# Patient Record
Sex: Male | Born: 2000 | Race: White | Hispanic: No | Marital: Single | Smoking: Never smoker
Health system: Southern US, Community
[De-identification: ages and names within clinical notes are randomized; demographics above are authoritative.]

---
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
## 2021-05-01 ENCOUNTER — Telehealth: Payer: Self-pay | Admitting: Cardiology

## 2021-05-01 NOTE — Addendum Note (Signed)
Addended by: Kerney Elbe on: 05/01/2021 02:08 PM   Modules accepted: Orders

## 2021-05-01 NOTE — Telephone Encounter (Signed)
New Echo order placed.

## 2021-05-01 NOTE — Telephone Encounter (Signed)
Patient walked into office asking to have his echo re-scheduled.  Will need a new order placed and then will contact patient.

## 2021-06-12 ENCOUNTER — Ambulatory Visit (HOSPITAL_COMMUNITY)
Admission: RE | Admit: 2021-06-12 | Discharge: 2021-06-12 | Disposition: A | Discharge status: Home / Self Care | Payer: Self-pay | Source: Ambulatory Visit | Attending: Cardiology | Admitting: Cardiology

## 2021-06-12 ENCOUNTER — Telehealth: Payer: Self-pay

## 2021-06-12 ENCOUNTER — Other Ambulatory Visit: Payer: Self-pay

## 2021-06-12 DIAGNOSIS — R0789 Other chest pain: Secondary | ICD-10-CM | POA: Insufficient documentation

## 2021-06-12 LAB — ECHOCARDIOGRAM COMPLETE
AR max vel: 1.74 cm2
AV Area VTI: 1.88 cm2
AV Area mean vel: 1.89 cm2
AV Mean grad: 6 mmHg
AV Peak grad: 13.4 mmHg
Ao pk vel: 1.83 m/s
Area-P 1/2: 2.95 cm2
S' Lateral: 2.7 cm

## 2021-06-12 NOTE — Progress Notes (Signed)
*  PRELIMINARY RESULTS* Echocardiogram 2D Echocardiogram has been performed.  Stacey Drain 06/12/2021, 3:05 PM

## 2021-06-12 NOTE — Telephone Encounter (Signed)
Patient notified and verbalized understanding. Pt had no questions or concerns at this time 

## 2021-06-12 NOTE — Telephone Encounter (Signed)
-----   Message from Jonelle Sidle, MD sent at 06/12/2021  4:28 PM EST ----- Results reviewed.  Reassuring results with normal LVEF at 60 to 65%, normal LV wall thickness as well.  Normal RV contraction and no significant valvular abnormalities.  Please see recent office note, no further cardiac work-up anticipated at this time.

## 2021-09-30 ENCOUNTER — Ambulatory Visit
Admission: EM | Admit: 2021-09-30 | Discharge: 2021-09-30 | Disposition: A | Discharge status: Home / Self Care | Payer: No Typology Code available for payment source

## 2021-09-30 ENCOUNTER — Other Ambulatory Visit: Payer: Self-pay

## 2021-09-30 DIAGNOSIS — R52 Pain, unspecified: Secondary | ICD-10-CM | POA: Diagnosis not present

## 2021-09-30 DIAGNOSIS — Z20828 Contact with and (suspected) exposure to other viral communicable diseases: Secondary | ICD-10-CM | POA: Diagnosis not present

## 2021-09-30 DIAGNOSIS — R509 Fever, unspecified: Secondary | ICD-10-CM | POA: Diagnosis not present

## 2021-09-30 DIAGNOSIS — R112 Nausea with vomiting, unspecified: Secondary | ICD-10-CM

## 2021-09-30 MED ORDER — ONDANSETRON 4 MG PO TBDP
4.0000 mg | ORAL_TABLET | Freq: Once | ORAL | Status: AC
  Administered 2021-09-30: 4 mg via ORAL

## 2021-09-30 MED ORDER — ONDANSETRON 8 MG PO TBDP: 8.0000 mg | ORAL_TABLET | Freq: Three times a day (TID) | ORAL | 0 refills | Status: AC | PRN

## 2021-09-30 NOTE — ED Provider Notes (Signed)
?[facility] ? ? ? ?CSN: [id number] ?Arrival date & time: 09/30/21  0919 ? ? ?  ? ?History   ?Chief Complaint ?Chief Complaint  ?Patient presents with  ? Emesis  ?  Body aches, weak and vomiting ?  ? ? ?HPI ?Robert Harvey is a 21 y.o. male.  ? ?Presenting today with 1 day history of sudden onset nausea, vomiting, weakness, generalized body aches, fevers, sweats.  Denies cough, congestion, sore throat, diarrhea, constipation, chest pain, shortness of breath.  Has not taken anything over-the-counter for symptoms thus far.  States he ate some fast food last night and wonders if that made him sick.  No known sick contacts recently. ? ? ?History reviewed. No pertinent past medical history. ? ?There are no problems to display for this patient. ? ? ?History reviewed. No pertinent surgical history. ? ? ? ? ?Home Medications   ? ?Prior to Admission medications   ?Medication Sig Start Date End Date Taking? Authorizing Provider  ?ondansetron (ZOFRAN-ODT) 8 MG disintegrating tablet Take 1 tablet (8 mg total) by mouth every 8 (eight) hours as needed for nausea or vomiting. 09/30/21  Yes Particia Nearing, PA-C  ?escitalopram (LEXAPRO) 10 MG tablet Take 10 mg by mouth daily. 09/10/21   [provider]  ? ? ?Family History ?Family History  ?Problem Relation Age of Onset  ? Hypertension Father   ? Heart attack Sister 24  ?     After pregnancy  ? Heart attack Paternal Grandfather   ? ? ?Social History ?Social History  ? ?Tobacco Use  ? Smoking status: Never  ? Smokeless tobacco: Never  ?Vaping Use  ? Vaping Use: Every day  ? Substances: Nicotine, Flavoring  ?Substance Use Topics  ? Alcohol use: Yes  ?  Comment: "a little bit"   ? Drug use: No  ? ? ? ?Allergies   ?Patient has no known allergies. ? ? ?Review of Systems ?Review of Systems ?Per HPI ? ?Physical Exam ?Triage Vital Signs ?ED Triage Vitals  ?Enc Vitals Group  ?   BP 09/30/21 0940 (!) 90/57  ?   Pulse Rate 09/30/21 0940 (!) 112  ?   Resp 09/30/21 0940  20  ?   Temp 09/30/21 0940 99.6 ?F (37.6 ?C)  ?   Temp Source 09/30/21 0940 Oral  ?   SpO2 09/30/21 0940 95 %  ?   Weight --   ?   Height --   ?   Head Circumference --   ?   Peak Flow --   ?   Pain Score 09/30/21 0939 6  ?   Pain Loc --   ?   Pain Edu? --   ?   Excl. in GC? --   ? ?No data found. ? ?Updated Vital Signs ?BP (!) 90/57 (BP Location: Right Arm)   Pulse (!) 112   Temp 99.6 ?F (37.6 ?C) (Oral)   Resp 20   SpO2 95%  ? ?Visual Acuity ?Right Eye Distance:   ?Left Eye Distance:   ?Bilateral Distance:   ? ?Right Eye Near:   ?Left Eye Near:    ?Bilateral Near:    ? ?Physical Exam ?Vitals and nursing note reviewed.  ?Constitutional:   ?   Appearance: Normal appearance.  ?HENT:  ?   Head: Atraumatic.  ?   Nose: Nose normal.  ?   Mouth/Throat:  ?   Mouth: Mucous membranes are moist.  ?   Pharynx: No oropharyngeal exudate or  posterior oropharyngeal erythema.  ?Eyes:  ?   Extraocular Movements: Extraocular movements intact.  ?   Conjunctiva/sclera: Conjunctivae normal.  ?Cardiovascular:  ?   Rate and Rhythm: Normal rate and regular rhythm.  ?   Heart sounds: Normal heart sounds.  ?Pulmonary:  ?   Effort: Pulmonary effort is normal.  ?   Breath sounds: Normal breath sounds.  ?Abdominal:  ?   General: Bowel sounds are normal. There is no distension.  ?   Palpations: Abdomen is soft.  ?   Tenderness: There is no abdominal tenderness. There is no guarding.  ?Musculoskeletal:     ?   General: Normal range of motion.  ?   Cervical back: Normal range of motion and neck supple.  ?Skin: ?   General: Skin is warm and dry.  ?Neurological:  ?   General: No focal deficit present.  ?   Mental Status: He is oriented to person, place, and time.  ?Psychiatric:     ?   Mood and Affect: Mood normal.     ?   Thought Content: Thought content normal.     ?   Judgment: Judgment normal.  ? ? ?UC Treatments / Results  ?Labs ?(all labs ordered are listed, but only abnormal results are displayed) ?Labs Reviewed  ?COVID-19, FLU A+B NAA   ? ? ?EKG ? ? ?Radiology ?No results found. ? ?Procedures ?Procedures (including critical care time) ? ?Medications Ordered in UC ?Medications  ?ondansetron (ZOFRAN-ODT) disintegrating tablet 4 mg (4 mg Oral Given 09/30/21 1024)  ? ? ?Initial Impression / Assessment and Plan / UC Course  ?I have reviewed the triage vital signs and the nursing notes. ? ?Pertinent labs & imaging results that were available during my care of the patient were reviewed by me and considered in my medical decision making (see chart for details). ? ?  ? ?Zofran given for active nausea and vomiting in triage, vitals and exam overall reassuring other than mild tachycardia and hypotension likely related to excessive vomiting this morning.  He is tolerating p.o. well after dose of Zofran and states he is feeling much better symptomatically though still very fatigued.  Zofran sent to pharmacy for as needed use, brat diet, fluids.  Work note given.  Return for acutely worsening symptoms. ? ?Final Clinical Impressions(s) / UC Diagnoses  ? ?Final diagnoses:  ?Exposure to the flu  ?Nausea and vomiting, unspecified vomiting type  ?Fever, unspecified  ?Body aches  ? ?Discharge Instructions   ?None ?  ? ?ED Prescriptions   ? ? Medication Sig Dispense Auth. Provider  ? ondansetron (ZOFRAN-ODT) 8 MG disintegrating tablet Take 1 tablet (8 mg total) by mouth every 8 (eight) hours as needed for nausea or vomiting. 20 tablet Particia Nearing, [place]  ? ?  ? ?PDMP not reviewed this encounter. ?  ?Particia Nearing, PA-C ?09/30/21 1026 ? ?

## 2021-09-30 NOTE — ED Triage Notes (Signed)
Pt states that he think he got food poisoning from a fast food place yesterday ? ?Pt states he has vomited all night and not able to keep anything down ? ?Pt states he is having body aches starting this morning ?

## 2021-10-01 LAB — COVID-19, FLU A+B NAA
Influenza A, NAA: NOT DETECTED
Influenza B, NAA: NOT DETECTED
SARS-CoV-2, NAA: NOT DETECTED
